# Patient Record
Sex: Female | Born: 2005 | Race: White | Hispanic: No | Marital: Single | Smoking: Never smoker
Health system: Southern US, Community
[De-identification: ages and names within clinical notes are randomized; demographics above are authoritative.]

---
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
## 2024-02-12 ENCOUNTER — Emergency Department
Admission: EM | Admit: 2024-02-12 | Discharge: 2024-02-12 | Disposition: A | Discharge status: Home / Self Care | Attending: Emergency Medicine | Admitting: Emergency Medicine

## 2024-02-12 ENCOUNTER — Other Ambulatory Visit: Payer: Self-pay

## 2024-02-12 DIAGNOSIS — N390 Urinary tract infection, site not specified: Secondary | ICD-10-CM | POA: Diagnosis not present

## 2024-02-12 DIAGNOSIS — B9689 Other specified bacterial agents as the cause of diseases classified elsewhere: Secondary | ICD-10-CM | POA: Insufficient documentation

## 2024-02-12 DIAGNOSIS — N760 Acute vaginitis: Secondary | ICD-10-CM | POA: Diagnosis not present

## 2024-02-12 DIAGNOSIS — R300 Dysuria: Secondary | ICD-10-CM | POA: Diagnosis present

## 2024-02-12 LAB — URINALYSIS, ROUTINE W REFLEX MICROSCOPIC
Bilirubin Urine: NEGATIVE
Glucose, UA: NEGATIVE mg/dL
Ketones, ur: NEGATIVE mg/dL
Nitrite: NEGATIVE
Protein, ur: 30 mg/dL — AB
RBC / HPF: >50 RBC/hpf (ref 0–5)
Specific Gravity, Urine: 1.019 (ref 1.005–1.030)
WBC, UA: >50 WBC/hpf (ref 0–5)
pH: 5 (ref 5.0–8.0)

## 2024-02-12 LAB — WET PREP, GENITAL
Sperm: NONE SEEN
Trich, Wet Prep: NONE SEEN
WBC, Wet Prep HPF POC: <10 (ref <10)
Yeast Wet Prep HPF POC: NONE SEEN

## 2024-02-12 LAB — POC URINE PREG, ED: Preg Test, Ur: NEGATIVE

## 2024-02-12 MED ORDER — FLUCONAZOLE 150 MG PO TABS: 150.0000 mg | ORAL_TABLET | Freq: Every day | ORAL | 0 refills | Status: AC

## 2024-02-12 MED ORDER — CEPHALEXIN 500 MG PO CAPS: 500.0000 mg | ORAL_CAPSULE | Freq: Two times a day (BID) | ORAL | 0 refills | Status: AC

## 2024-02-12 MED ORDER — METRONIDAZOLE 500 MG PO TABS: 500.0000 mg | ORAL_TABLET | Freq: Two times a day (BID) | ORAL | 0 refills | Status: AC

## 2024-02-12 NOTE — Discharge Instructions (Addendum)
 You were evaluated in the ED for burning with urination.  Your urinalysis reveals a urinary tract infection and your wet prep reveals positive for BV in which you will be treated with antibiotics.  Please take these antibiotics as directed and until dose is complete.   Please follow-up with OB/GYN for recurrent UTIs.

## 2024-02-12 NOTE — ED Triage Notes (Signed)
 Pt comes with UTI and yeast symptoms that started 3 days ago. Pt states months ago she did have yeast infection and did the 1 pill otc. Pt states it has still itched since then. Pt denies any known discharge but pt is on period.   Pt states severe pain with urination and some swelling. Pt denies any recent intercourse. Pt denies any new soaps, lotions etc.

## 2024-02-12 NOTE — ED Provider Notes (Signed)
 [facility] Emergency Department Provider Note     Event Date/Time   First MD Initiated Contact with Patient 02/12/24 1720     (approximate)   History   Dysuria   HPI  Jeanne Hansen is a 18 y.o. female with a past medical history of frequent UTIs presents to the ED for evaluation of dysuria and vaginal itching x 3 days.  Denies vaginal bleeding however patient is currently on menstrual cycle.  She also reports that the vaginal itching causes her to scratch in which she noted a small amount of blood under her fingernail. She denies abdominal pain, nausea and vomiting, and fever. No flank pain.  Patient denies sexual activity in 2 to 3 months and has no concern for STD or STI during this visit.      Physical Exam   Triage Vital Signs: ED Triage Vitals  Encounter Vitals Group     BP 02/12/24 1634 109/61     Systolic BP Percentile --      Diastolic BP Percentile --      Pulse Rate 02/12/24 1631 67     Resp 02/12/24 1631 18     Temp 02/12/24 1631 98 F (36.7 C)     Temp src --      SpO2 02/12/24 1631 98 %     Weight 02/12/24 1631 120 lb (54.4 kg)     Height 02/12/24 1631 5\' 6"  (1.676 m)     Head Circumference --      Peak Flow --      Pain Score 02/12/24 1631 6     Pain Loc --      Pain Education --      Exclude from Growth Chart --     Most recent vital signs: Vitals:   02/12/24 1631 02/12/24 1634  BP:  109/61  Pulse: 67   Resp: 18   Temp: 98 F (36.7 C)   SpO2: 98%     General Awake, no distress.  HEENT NCAT. PERRL. EOMI. CV:  Good peripheral perfusion.  RESP:  Normal effort.  ABD:  No distention.  Soft nontender.  No CVA tenderness bilaterally. GU:  Deferred by patient.    ED Results / Procedures / Treatments   Labs (all labs ordered are listed, but only abnormal results are displayed) Labs Reviewed  WET PREP, GENITAL - Abnormal; Notable for the following components:      Result Value   Clue Cells Wet Prep HPF POC  PRESENT (*)    All other components within normal limits  URINALYSIS, ROUTINE W REFLEX MICROSCOPIC - Abnormal; Notable for the following components:   Color, Urine AMBER (*)    APPearance HAZY (*)    Hgb urine dipstick LARGE (*)    Protein, ur 30 (*)    Leukocytes,Ua LARGE (*)    Bacteria, UA RARE (*)    All other components within normal limits  POC URINE PREG, ED - Normal  URINE CULTURE   No results found.  PROCEDURES:  Critical Care performed: No  Procedures   MEDICATIONS ORDERED IN ED: Medications - No data to display   IMPRESSION / MDM / ASSESSMENT AND PLAN / ED COURSE  I reviewed the triage vital signs and the nursing notes.                                  18 y.o. female presents  to the emergency department for evaluation and treatment of vaginal itching and dysuria. See HPI for further details.   Differential diagnosis includes, but is not limited to UTI, BV, yeast, pyelonephritis  Patient's presentation is most consistent with acute complicated illness / injury requiring diagnostic workup.  Patient is alert and oriented.  She is hemodynamically stable and afebrile.  Urinalysis reveals large amount of leukocytes and bacteria.  Does note large Hgb, however patient states she is currently on her menstrual cycle which I believe is contributing to this result.  Pregnancy test is negative.  Wet prep reveals clue cells.  Patient did do a self swab as she did not want a pelvic exam performed.  She declined STD/STI screening.  Negative CVA tenderness on exam making pyelonephritis lower on my differential.  Given recurrent UTI status I have discussed with patient to follow-up closely with GYN for further evaluation.  Will treat UTI and BV with Keflex and Flagyl.  Patient is in stable condition for discharge home and outpatient management. ED return precautions discussed   FINAL CLINICAL IMPRESSION(S) / ED DIAGNOSES   Final diagnoses:  Lower urinary tract infectious  disease  BV (bacterial vaginosis)    Rx / DC Orders   ED Discharge Orders          Ordered    fluconazole (DIFLUCAN) 150 MG tablet  Daily        02/12/24 1909    cephALEXin (KEFLEX) 500 MG capsule  2 times daily        02/12/24 1909    metroNIDAZOLE (FLAGYL) 500 MG tablet  2 times daily        02/12/24 1909             Note:  This document was prepared using Dragon voice recognition software and may include unintentional dictation errors.    Romeo Apple, Savaya Hakes A, PA-C 02/12/24 1955    Claybon Jabs, MD 02/12/24 2010

## 2024-02-13 LAB — URINE CULTURE: Culture: NO GROWTH
